# Patient Record
Sex: Female | Born: 1980 | Race: White | Hispanic: No | Marital: Single | Smoking: Never smoker
Health system: Southern US, Community
[De-identification: ages and names within clinical notes are randomized; demographics above are authoritative.]

## PROBLEM LIST (undated history)

## (undated) DIAGNOSIS — F329 Major depressive disorder, single episode, unspecified: Secondary | ICD-10-CM

## (undated) DIAGNOSIS — M797 Fibromyalgia: Secondary | ICD-10-CM

## (undated) DIAGNOSIS — F419 Anxiety disorder, unspecified: Secondary | ICD-10-CM

## (undated) DIAGNOSIS — F32A Depression, unspecified: Secondary | ICD-10-CM

---

## 2013-01-23 ENCOUNTER — Emergency Department (HOSPITAL_BASED_OUTPATIENT_CLINIC_OR_DEPARTMENT_OTHER): Payer: BC Managed Care – PPO

## 2013-01-23 ENCOUNTER — Encounter: Payer: Self-pay | Admitting: Emergency Medicine

## 2013-01-23 ENCOUNTER — Encounter (HOSPITAL_BASED_OUTPATIENT_CLINIC_OR_DEPARTMENT_OTHER): Payer: Self-pay | Admitting: Emergency Medicine

## 2013-01-23 ENCOUNTER — Emergency Department
Admission: EM | Admit: 2013-01-23 | Discharge: 2013-01-23 | Payer: BC Managed Care – PPO | Source: Home / Self Care | Attending: Emergency Medicine | Admitting: Emergency Medicine

## 2013-01-23 ENCOUNTER — Emergency Department (HOSPITAL_BASED_OUTPATIENT_CLINIC_OR_DEPARTMENT_OTHER)
Admission: EM | Admit: 2013-01-23 | Discharge: 2013-01-23 | Disposition: A | Discharge status: Home / Self Care | Payer: BC Managed Care – PPO | Attending: Emergency Medicine | Admitting: Emergency Medicine

## 2013-01-23 DIAGNOSIS — S61012A Laceration without foreign body of left thumb without damage to nail, initial encounter: Secondary | ICD-10-CM

## 2013-01-23 DIAGNOSIS — W298XXA Contact with other powered powered hand tools and household machinery, initial encounter: Secondary | ICD-10-CM | POA: Insufficient documentation

## 2013-01-23 DIAGNOSIS — Z880 Allergy status to penicillin: Secondary | ICD-10-CM | POA: Insufficient documentation

## 2013-01-23 DIAGNOSIS — F3289 Other specified depressive episodes: Secondary | ICD-10-CM | POA: Insufficient documentation

## 2013-01-23 DIAGNOSIS — S61209A Unspecified open wound of unspecified finger without damage to nail, initial encounter: Secondary | ICD-10-CM

## 2013-01-23 DIAGNOSIS — R209 Unspecified disturbances of skin sensation: Secondary | ICD-10-CM | POA: Insufficient documentation

## 2013-01-23 DIAGNOSIS — Z791 Long term (current) use of non-steroidal anti-inflammatories (NSAID): Secondary | ICD-10-CM | POA: Insufficient documentation

## 2013-01-23 DIAGNOSIS — Z79899 Other long term (current) drug therapy: Secondary | ICD-10-CM | POA: Insufficient documentation

## 2013-01-23 DIAGNOSIS — F329 Major depressive disorder, single episode, unspecified: Secondary | ICD-10-CM | POA: Insufficient documentation

## 2013-01-23 DIAGNOSIS — Y9389 Activity, other specified: Secondary | ICD-10-CM | POA: Insufficient documentation

## 2013-01-23 DIAGNOSIS — Z23 Encounter for immunization: Secondary | ICD-10-CM | POA: Insufficient documentation

## 2013-01-23 DIAGNOSIS — Y929 Unspecified place or not applicable: Secondary | ICD-10-CM | POA: Insufficient documentation

## 2013-01-23 DIAGNOSIS — F411 Generalized anxiety disorder: Secondary | ICD-10-CM | POA: Insufficient documentation

## 2013-01-23 HISTORY — DX: Major depressive disorder, single episode, unspecified: F32.9

## 2013-01-23 HISTORY — DX: Anxiety disorder, unspecified: F41.9

## 2013-01-23 HISTORY — DX: Depression, unspecified: F32.A

## 2013-01-23 HISTORY — DX: Fibromyalgia: M79.7

## 2013-01-23 MED ORDER — KETOROLAC TROMETHAMINE 30 MG/ML IJ SOLN
60.0000 mg | Freq: Once | INTRAMUSCULAR | Status: AC
  Administered 2013-01-23: 60 mg via INTRAMUSCULAR

## 2013-01-23 MED ORDER — HYDROCODONE-ACETAMINOPHEN 5-325 MG PO TABS: 2.0000 | ORAL_TABLET | ORAL | Status: AC | PRN

## 2013-01-23 MED ORDER — CLINDAMYCIN HCL 150 MG PO CAPS: 150.0000 mg | ORAL_CAPSULE | Freq: Four times a day (QID) | ORAL | Status: AC

## 2013-01-23 MED ORDER — OXYCODONE-ACETAMINOPHEN 5-325 MG PO TABS
2.0000 | ORAL_TABLET | Freq: Once | ORAL | Status: AC
  Administered 2013-01-23: 2 via ORAL
  Filled 2013-01-23: qty 2

## 2013-01-23 MED ORDER — TETANUS-DIPHTH-ACELL PERTUSSIS 5-2.5-18.5 LF-MCG/0.5 IM SUSP
0.5000 mL | Freq: Once | INTRAMUSCULAR | Status: AC
  Administered 2013-01-23: 0.5 mL via INTRAMUSCULAR
  Filled 2013-01-23: qty 0.5

## 2013-01-23 MED ORDER — BUPIVACAINE HCL (PF) 0.5 % IJ SOLN
INTRAMUSCULAR | Status: AC
  Filled 2013-01-23: qty 10

## 2013-01-23 NOTE — ED Provider Notes (Signed)
CSN: [id number]     Arrival date & time 01/23/13  1221 History   First MD Initiated Contact with Patient 01/23/13 1313     Chief Complaint  Patient presents with  . Extremity Laceration   (Consider location/radiation/quality/duration/timing/severity/associated sxs/prior Treatment) HPI Cut left thumb with new blade box cutter at 1100 today.   Past Medical History  Diagnosis Date  . Fibromyalgia   . Anxiety and depression    History reviewed. No pertinent past surgical history. History reviewed. No pertinent family history. History  Substance Use Topics  . Smoking status: Never Smoker   . Smokeless tobacco: Not on file  . Alcohol Use: No   OB History   Grav Para Term Preterm Abortions TAB SAB Ect Mult Living                 Review of Systems  Allergies  Amoxicillin and Duricef  Home Medications   Current Outpatient Rx  Name  Route  Sig  Dispense  Refill  . celecoxib (CELEBREX) 200 MG capsule   Oral   Take 200 mg by mouth 2 (two) times daily.         . clonazePAM (KLONOPIN) 0.5 MG tablet   Oral   Take 20 mg by mouth 3 (three) times daily as needed for anxiety.         Marland Kitchen. LORazepam (ATIVAN) 1 MG tablet   Oral   Take 1 mg by mouth every 8 (eight) hours.          BP 124/77  Pulse 73  Temp(Src) 98 F (36.7 C) (Oral)  Resp 16  Ht 5\' 3"  (1.6 m)  Wt 114 lb (51.71 kg)  BMI 20.20 kg/m2  SpO2 100% Physical Exam  Constitutional: She appears well-developed and well-nourished. She appears distressed (In severe pain from left thumb pain).  Musculoskeletal:       Hands: Gaping wound sagittally through distal left thumb, transecting the nail sagittally, deep towards the distal phalanx bone.  Bleeding stopped with direct pressure.  Neurological: She is alert.    ED Course  Procedures (including critical care time) Labs Review Labs Reviewed - No data to display Imaging Review No results found.  EKG Interpretation    Date/Time:    Ventricular Rate:      PR Interval:    QRS Duration:   QT Interval:    QTC Calculation:   R Axis:     Text Interpretation:              MDM   1. Laceration of left thumb with complication    Deep sagittal laceration left thumb, through fingernail extending toward bone. Toradol 60 mg IM stat at patient's request for shot of acute pain med.--She tolerated this well and pain relieved somewhat. L thumb redressed, and referred to ER stat for further diagnosis and treatment because of the severity of the laceration --Patient and aunt preferred emergency room at [facility]. [facility], and explained situation and acuity.  Family member (aunt) drove her to ER stat.  Lajean Manesavid Massey, MD 01/23/13 1356

## 2013-01-23 NOTE — ED Provider Notes (Signed)
CSN: [id number]     Arrival date & time 01/23/13  1349 History  This chart was scribed for Glynn Octave, MD by Blanchard Kelch, ED Scribe. The patient was seen in room MH03/MH03. Patient's care was started at 3:19 PM.    Chief Complaint  Patient presents with  . Extremity Laceration    The history is provided by the patient. No language interpreter was used.    HPI Comments: Christina Henson is a 33 y.o. female who presents to the Emergency Department complaining of a left thumb laceration that occurred four hours ago. She states that she accidentally sliced it with a box cutter. She was seen at Urgent Care but was sent here due to the severity of the laceration. The bleeding is currently controlled. She is complaining of constant, moderate pain to the associated area onset immediately after the cutting it. She has associated numbness in her thumb that radiates up to the elbow. She is not up to date on her tetanus vaccination and did not receive a booster at the Urgent Care. She is allergic to Amoxicillin and Duricef.   Past Medical History  Diagnosis Date  . Fibromyalgia   . Anxiety and depression    History reviewed. No pertinent past surgical history. No family history on file. History  Substance Use Topics  . Smoking status: Never Smoker   . Smokeless tobacco: Not on file  . Alcohol Use: No   OB History   Grav Para Term Preterm Abortions TAB SAB Ect Mult Living                 Review of Systems A complete 10 system review of systems was obtained and all systems are negative except as noted in the HPI and PMH.    Allergies  Amoxicillin and Duricef  Home Medications   Current Outpatient Rx  Name  Route  Sig  Dispense  Refill  . celecoxib (CELEBREX) 200 MG capsule   Oral   Take 200 mg by mouth 2 (two) times daily.         . clindamycin (CLEOCIN) 150 MG capsule   Oral   Take 1 capsule (150 mg total) by mouth every 6 (six) hours.   28 capsule   0   . clonazePAM  (KLONOPIN) 0.5 MG tablet   Oral   Take 20 mg by mouth 3 (three) times daily as needed for anxiety.         Marland Kitchen HYDROcodone-acetaminophen (NORCO/VICODIN) 5-325 MG per tablet   Oral   Take 2 tablets by mouth every 4 (four) hours as needed.   10 tablet   0   . LORazepam (ATIVAN) 1 MG tablet   Oral   Take 1 mg by mouth every 8 (eight) hours.          Triage Vitals: BP 132/81  Pulse 74  Temp(Src) 99.3 F (37.4 C) (Oral)  Resp 18  SpO2 100%  Physical Exam  Nursing note and vitals reviewed. Constitutional: She is oriented to person, place, and time. She appears well-developed and well-nourished. No distress.  HENT:  Head: Normocephalic and atraumatic.  Mouth/Throat: Oropharynx is clear and moist. No oropharyngeal exudate.  Eyes: Conjunctivae and EOM are normal. Pupils are equal, round, and reactive to light.  Neck: Neck supple. No tracheal deviation present.  Cardiovascular: Normal rate, regular rhythm and normal heart sounds.   No murmur heard. Pulses:      Radial pulses are 2+ on the left side.  2+ radial  pulse.   Pulmonary/Chest: Effort normal. No respiratory distress.  Abdominal: Soft. There is no tenderness. There is no rebound and no guarding.  Musculoskeletal: Normal range of motion. She exhibits no edema and no tenderness.  Neurological: She is alert and oriented to person, place, and time. No cranial nerve deficit. She exhibits normal muscle tone. Coordination normal.  Skin: Skin is warm and dry.  Laceration of left thumb tip, sagittal through nailbed. Thumb extension, flexion, opposition and adduction intact. Distal sensation intact.   Psychiatric: She has a normal mood and affect. Her behavior is normal.    ED Course  Procedures (including critical care time)  DIAGNOSTIC STUDIES: Oxygen Saturation is 100% on room air, normal by my interpretation.    COORDINATION OF CARE: 3:24 PM -Waiting for x-ray results to decide treatment plan. Patient verbalizes  understanding and agrees with treatment plan.    Labs Review Labs Reviewed - No data to display Imaging Review Dg Finger Thumb Left  01/23/2013   CLINICAL DATA:  Deep laceration distal in the the thumb, caught within nights this morning  EXAM: LEFT THUMB 2+V  COMPARISON:  None.  FINDINGS: Bandage obscures bony and soft tissue detail. Allowing for this, soft tissue defect is identified. No opaque foreign body is appreciated. No fracture is appreciated.  IMPRESSION: Study limited by presence of bandage. Allowing for this, no fracture.   Electronically Signed   By: Esperanza Heiraymond  Rubner M.D.   On: 01/23/2013 15:41    EKG Interpretation   None       MDM   1. Laceration of thumb, left, complicated    Left thumb laceration from a box cutter about 11:30 AM. Sent from urgent care as wound goes through nailbed and distal thumb tip. No weakness, numbness or tingling. Bleeding controlled.   X-ray, update tetanus. X-rays negative for fracture. No evidence of tendon, nerve or vascular involvement.  Laceration repaired by NP Neese. Patient will be given antibiotics, pain control, referral to hand surgery.    I personally performed the services described in this documentation, which was scribed in my presence. The recorded information has been reviewed and is accurate.    Glynn OctaveStephen Leilynn Pilat, MD 01/23/13 [phone]

## 2013-01-23 NOTE — ED Provider Notes (Signed)
THIS IS A SHARED VISIT WITH DR. Manus GunningANCOUR.  [name] is a 33 y.o. female with a laceration to the left thumb.  BP 132/81  Pulse 74  Temp(Src) 99.3 F (37.4 C) (Oral)  Resp 18  SpO2 100%  LMP 01/16/2013   LACERATION REPAIR Performed by: Tenicia Gural Authorized by: Bernon Arviso Consent: Verbal consent obtained. Risks and benefits: risks, benefits and alternatives were discussed Consent given by: patient  Patient identity confirmed: provided demographic data Prepped and Draped in normal sterile fashion  Wound explored  Laceration Location: left thumb through the nail on the radial aspect.   Laceration Length: 3 cm avulsion    No Foreign Bodies seen or palpated  Digital block with Sensorcaine 0.25 and lidocaine 2% mixed total 4 ccs  Irrigation method: syringe Amount of cleaning: standard  Skin closure: 5-0 prolene  Number of sutures: 4 (two of the sutures were through the nail)  Technique: interrupted  Patient tolerance: Patient tolerated the procedure well with no immediate complications.   [facility] Christina OchM Kristofor Michalowski, NP 01/24/13 0110

## 2013-01-23 NOTE — ED Notes (Signed)
Cut left thumb with new blade box cutter at 1100 today. Will need Tdap.

## 2013-01-23 NOTE — ED Notes (Addendum)
Patient here from an urgent care for further evaluation of deep laceration to left thumb, cut with knife this am. Received toradol pta

## 2013-01-23 NOTE — Discharge Instructions (Signed)
Return in 2 days to recheck the wound. Then the sutures will need to be removed in 7 days. Return immediately for signs of infection.

## 2013-01-24 NOTE — ED Provider Notes (Signed)
Medical screening examination/treatment/procedure(s) were conducted as a shared visit with non-physician practitioner(s) and myself.  I personally evaluated the patient during the encounter.  EKG Interpretation   None        Glynn OctaveStephen Jael Kostick, MD 01/24/13 1139

## 2013-01-25 ENCOUNTER — Encounter (HOSPITAL_BASED_OUTPATIENT_CLINIC_OR_DEPARTMENT_OTHER): Payer: Self-pay | Admitting: Emergency Medicine

## 2013-01-25 ENCOUNTER — Emergency Department (HOSPITAL_BASED_OUTPATIENT_CLINIC_OR_DEPARTMENT_OTHER)
Admission: EM | Admit: 2013-01-25 | Discharge: 2013-01-25 | Disposition: A | Discharge status: Home / Self Care | Payer: BC Managed Care – PPO | Attending: Emergency Medicine | Admitting: Emergency Medicine

## 2013-01-25 DIAGNOSIS — Z8739 Personal history of other diseases of the musculoskeletal system and connective tissue: Secondary | ICD-10-CM | POA: Insufficient documentation

## 2013-01-25 DIAGNOSIS — Z791 Long term (current) use of non-steroidal anti-inflammatories (NSAID): Secondary | ICD-10-CM | POA: Insufficient documentation

## 2013-01-25 DIAGNOSIS — F411 Generalized anxiety disorder: Secondary | ICD-10-CM | POA: Insufficient documentation

## 2013-01-25 DIAGNOSIS — Z4801 Encounter for change or removal of surgical wound dressing: Secondary | ICD-10-CM | POA: Insufficient documentation

## 2013-01-25 DIAGNOSIS — Z5189 Encounter for other specified aftercare: Secondary | ICD-10-CM

## 2013-01-25 MED ORDER — OXYCODONE-ACETAMINOPHEN 5-325 MG PO TABS: 1.0000 | ORAL_TABLET | ORAL | Status: AC | PRN

## 2013-01-25 NOTE — ED Notes (Signed)
Laceration to her left thumb 2 days ago. Sutured. Here for recheck and pain control.

## 2013-01-25 NOTE — ED Provider Notes (Signed)
CSN: [id number]     Arrival date & time 01/25/13  1720 History   First MD Initiated Contact with Patient 01/25/13 1929     Chief Complaint  Patient presents with  . Wound Check   (Consider location/radiation/quality/duration/timing/severity/associated sxs/prior Treatment) HPI Christina Henson is a 33 y.o. female who presents to ED with complaint of wound recheck. Pt states she cut her finger on a box cutter two days ago. Pt states that she had it repaired here in ED with sutures, she is here for recheck. States she was prescribed Norco for her pain but states she's unable to tolerate it due to severe nausea and itchiness. Patient states that her pain at is severe in her finger. She states that it is still oozing some clear liquid. She states that he did update her tetanus. She is also was placed on clindamycin which she's taking. She denies any redness, swelling, purulent drainage. She denies any new injuries. She denies any fever or chills.  Past Medical History  Diagnosis Date  . Fibromyalgia   . Anxiety and depression    History reviewed. No pertinent past surgical history. No family history on file. History  Substance Use Topics  . Smoking status: Never Smoker   . Smokeless tobacco: Not on file  . Alcohol Use: No   OB History   Grav Para Term Preterm Abortions TAB SAB Ect Mult Living                 Review of Systems  Constitutional: Negative for fever and chills.  Respiratory: Negative for cough, chest tightness and shortness of breath.   Cardiovascular: Negative for chest pain, palpitations and leg swelling.  Skin: Positive for wound. Negative for rash.  Neurological: Negative for dizziness, weakness and headaches.  All other systems reviewed and are negative.    Allergies  Amoxicillin and Duricef  Home Medications   Current Outpatient Rx  Name  Route  Sig  Dispense  Refill  . celecoxib (CELEBREX) 200 MG capsule   Oral   Take 200 mg by mouth 2 (two) times daily.         . clindamycin (CLEOCIN) 150 MG capsule   Oral   Take 1 capsule (150 mg total) by mouth every 6 (six) hours.   28 capsule   0   . clonazePAM (KLONOPIN) 0.5 MG tablet   Oral   Take 20 mg by mouth 3 (three) times daily as needed for anxiety.         Marland Kitchen HYDROcodone-acetaminophen (NORCO/VICODIN) 5-325 MG per tablet   Oral   Take 2 tablets by mouth every 4 (four) hours as needed.   10 tablet   0   . LORazepam (ATIVAN) 1 MG tablet   Oral   Take 1 mg by mouth every 8 (eight) hours.          BP 125/79  Pulse 86  Temp(Src) 98.6 F (37 C) (Oral)  Resp 16  SpO2 100%  LMP 01/16/2013 Physical Exam  Nursing note and vitals reviewed. Constitutional: She appears well-developed and well-nourished. No distress.  HENT:  Head: Normocephalic.  Eyes: Conjunctivae are normal.  Neck: Neck supple.  Pulmonary/Chest: Effort normal and breath sounds normal.  Musculoskeletal: She exhibits no edema.  Laceration to the distal left thumb, tip of the finger extending through distal 1/2 of the nail. 4 sutures intact. No dehiscence. No drainage. No erythema. Tender to the touch.   Neurological: She is alert.  Skin: Skin is warm and  dry.  Psychiatric: She has a normal mood and affect. Her behavior is normal.    ED Course  Procedures (including critical care time) Labs Review Labs Reviewed - No data to display Imaging Review No results found.  EKG Interpretation   None       MDM   1. Encounter for wound re-check     Patient is here for recheck of laceration to her from which was repaired 2 days ago. Her wound appears to be healing well at this time. There is no signs of infection or dehiscence. Patient is concerned because she kept hitting her thumb on different objects. We have applied a finger splint over the tip of the finger for protection. She is to apply antibiotic ointment twice a day. Continue to keep it clean. Followup for suture removal. Also unable to take norco, asking for  percocet. Will give few pills.   Filed Vitals:   01/25/13 1735  BP: 125/79  Pulse: 86  Temp: 98.6 F (37 C)  TempSrc: Oral  Resp: 16  SpO2: 100%       Noe Goyer A Dontavis Tschantz, PA-C 01/26/13 0050

## 2013-01-25 NOTE — Discharge Instructions (Signed)
Keep finger clean. Apply neosporin with pain relief. Take ibuprofen for pain. Percocet for severe pain only. Follow up with your doctor as needed and for suture relief.   Laceration Care, Adult A laceration is a cut or lesion that goes through all layers of the skin and into the tissue just beneath the skin. TREATMENT  Some lacerations may not require closure. Some lacerations may not be able to be closed due to an increased risk of infection. It is important to see your caregiver as soon as possible after an injury to minimize the risk of infection and maximize the opportunity for successful closure. If closure is appropriate, pain medicines may be given, if needed. The wound will be cleaned to help prevent infection. Your caregiver will use stitches (sutures), staples, wound glue (adhesive), or skin adhesive strips to repair the laceration. These tools bring the skin edges together to allow for faster healing and a better cosmetic outcome. However, all wounds will heal with a scar. Once the wound has healed, scarring can be minimized by covering the wound with sunscreen during the day for 1 full year. HOME CARE INSTRUCTIONS  For sutures or staples:  Keep the wound clean and dry.  If you were given a bandage (dressing), you should change it at least once a day. Also, change the dressing if it becomes wet or dirty, or as directed by your caregiver.  Wash the wound with soap and water 2 times a day. Rinse the wound off with water to remove all soap. Pat the wound dry with a clean towel.  After cleaning, apply a thin layer of the antibiotic ointment as recommended by your caregiver. This will help prevent infection and keep the dressing from sticking.  You may shower as usual after the first 24 hours. Do not soak the wound in water until the sutures are removed.  Only take over-the-counter or prescription medicines for pain, discomfort, or fever as directed by your caregiver.  Get your sutures or  staples removed as directed by your caregiver. For skin adhesive strips:  Keep the wound clean and dry.  Do not get the skin adhesive strips wet. You may bathe carefully, using caution to keep the wound dry.  If the wound gets wet, pat it dry with a clean towel.  Skin adhesive strips will fall off on their own. You may trim the strips as the wound heals. Do not remove skin adhesive strips that are still stuck to the wound. They will fall off in time. For wound adhesive:  You may briefly wet your wound in the shower or bath. Do not soak or scrub the wound. Do not swim. Avoid periods of heavy perspiration until the skin adhesive has fallen off on its own. After showering or bathing, gently pat the wound dry with a clean towel.  Do not apply liquid medicine, cream medicine, or ointment medicine to your wound while the skin adhesive is in place. This may loosen the film before your wound is healed.  If a dressing is placed over the wound, be careful not to apply tape directly over the skin adhesive. This may cause the adhesive to be pulled off before the wound is healed.  Avoid prolonged exposure to sunlight or tanning lamps while the skin adhesive is in place. Exposure to ultraviolet light in the first year will darken the scar.  The skin adhesive will usually remain in place for 5 to 10 days, then naturally fall off the skin. Do not  pick at the adhesive film. You may need a tetanus shot if:  You cannot remember when you had your last tetanus shot.  You have never had a tetanus shot. If you get a tetanus shot, your arm may swell, get red, and feel warm to the touch. This is common and not a problem. If you need a tetanus shot and you choose not to have one, there is a rare chance of getting tetanus. Sickness from tetanus can be serious. SEEK MEDICAL CARE IF:   You have redness, swelling, or increasing pain in the wound.  You see a red line that goes away from the wound.  You have  yellowish-white fluid (pus) coming from the wound.  You have a fever.  You notice a bad smell coming from the wound or dressing.  Your wound breaks open before or after sutures have been removed.  You notice something coming out of the wound such as wood or glass.  Your wound is on your hand or foot and you cannot move a finger or toe. SEEK IMMEDIATE MEDICAL CARE IF:   Your pain is not controlled with prescribed medicine.  You have severe swelling around the wound causing pain and numbness or a change in color in your arm, hand, leg, or foot.  Your wound splits open and starts bleeding.  You have worsening numbness, weakness, or loss of function of any joint around or beyond the wound.  You develop painful lumps near the wound or on the skin anywhere on your body. MAKE SURE YOU:   Understand these instructions.  Will watch your condition.  Will get help right away if you are not doing well or get worse. Document Released: 12/24/2004 Document Revised: 03/18/2011 Document Reviewed: 06/19/2010 [facility] Patient Information 2014 [place], [place].

## 2013-01-26 NOTE — ED Provider Notes (Signed)
Medical screening examination/treatment/procedure(s) were performed by non-physician practitioner and as supervising physician I was immediately available for consultation/collaboration.  Megan E Docherty, MD 01/26/13 1104 

## 2014-07-24 IMAGING — CR DG FINGER THUMB 2+V*L*
3 series · 3 of 3 positions shown · non-contrast
Comparison: None.

CLINICAL DATA: Deep laceration distal in the the thumb, caught
within nights this morning

EXAM:
LEFT THUMB 2+V

[x finger obl. left]
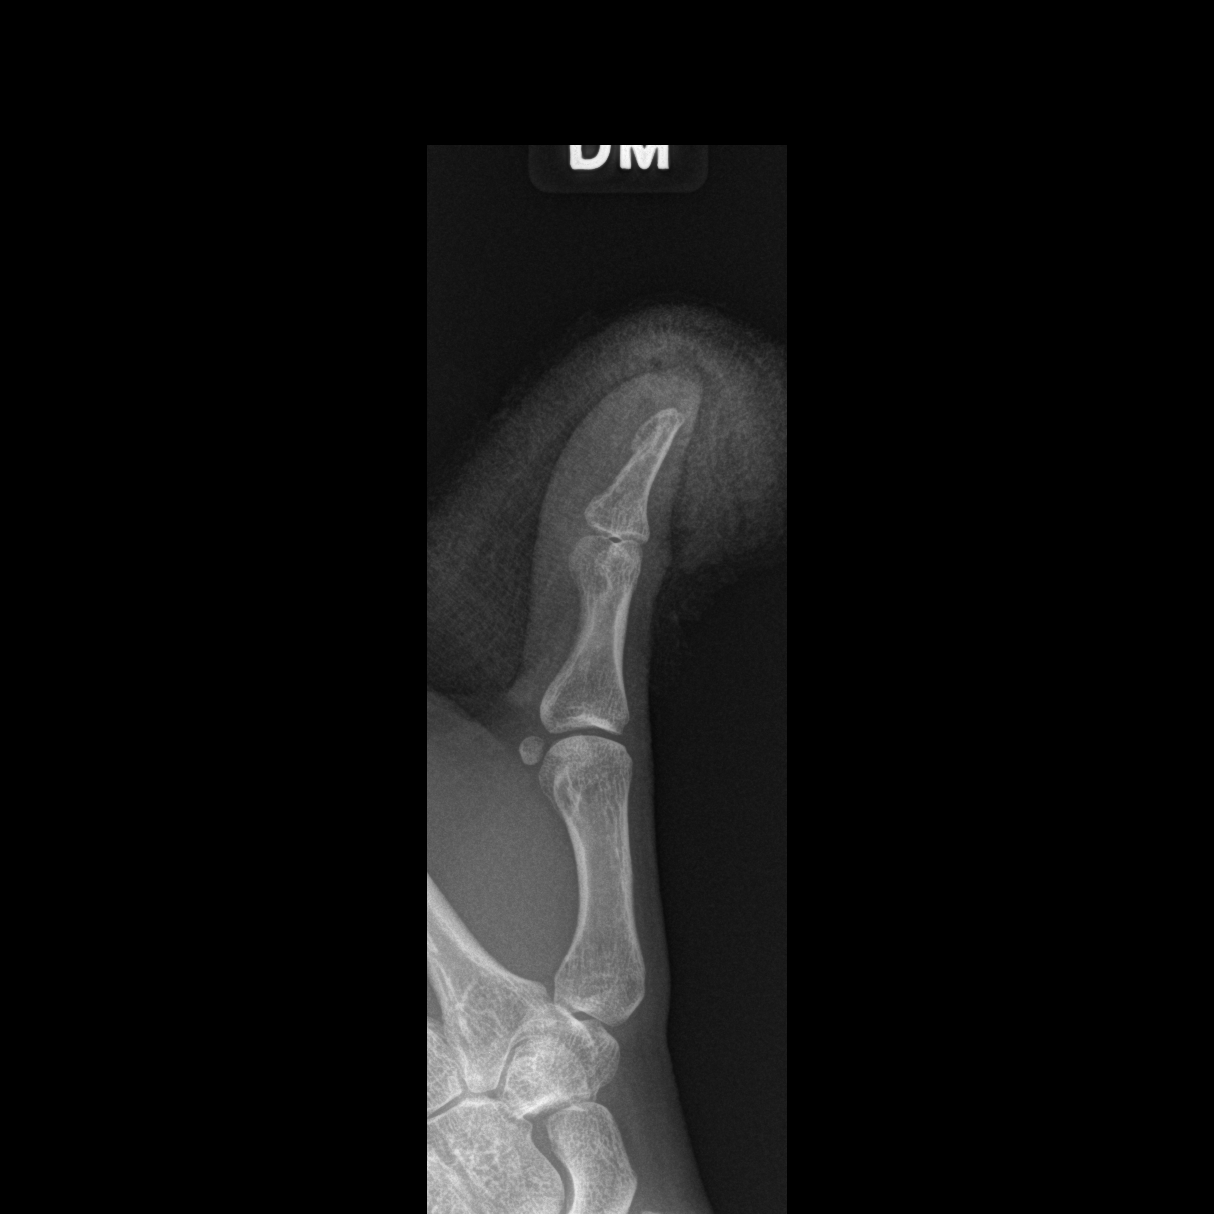

[x finger lateral left]
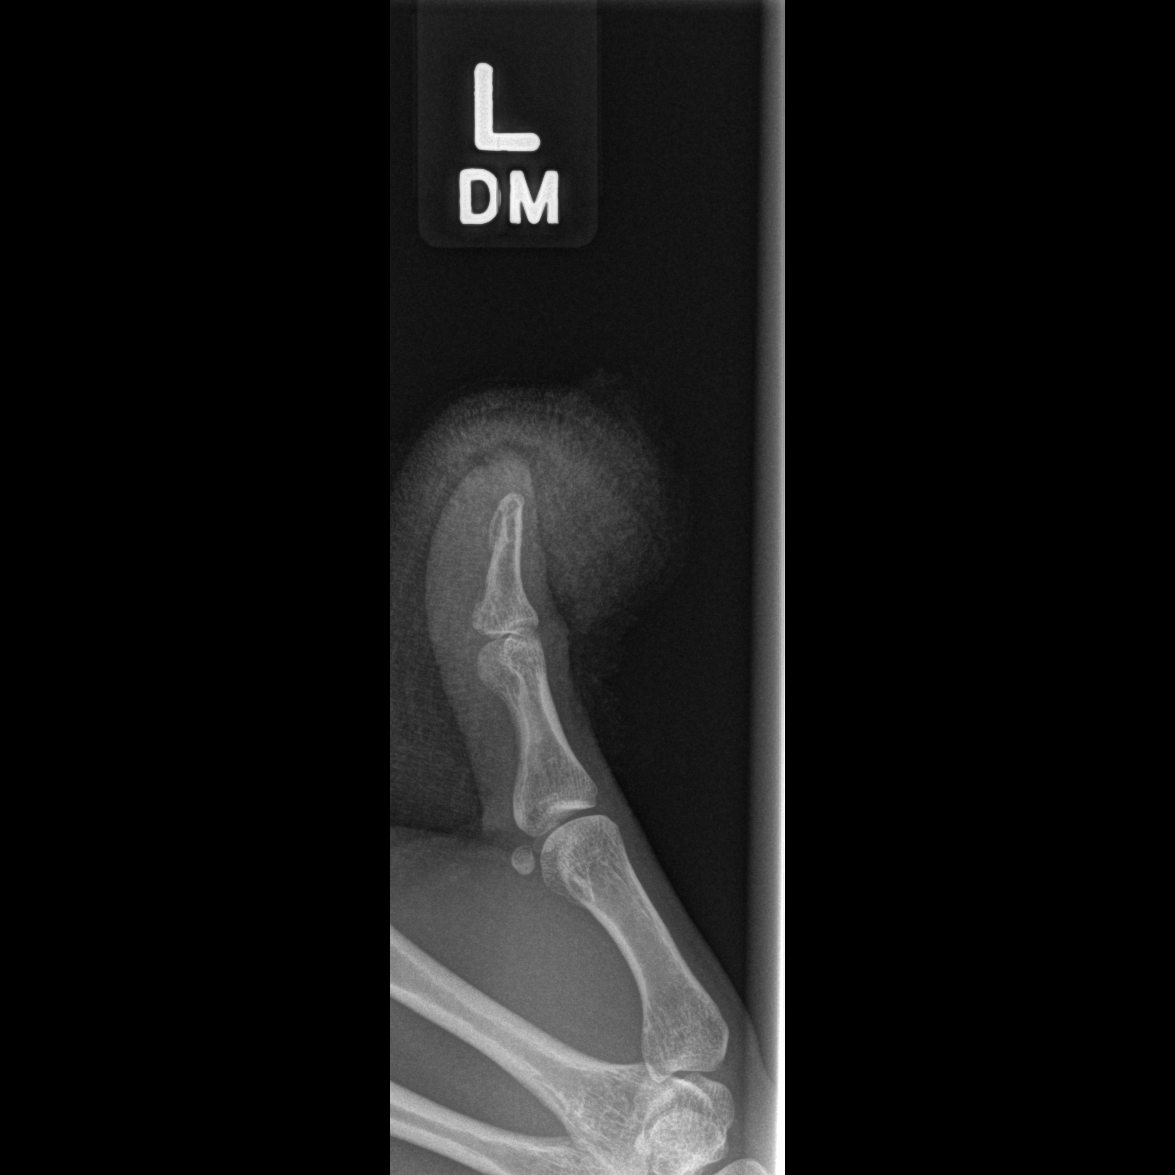

[x finger pa left]
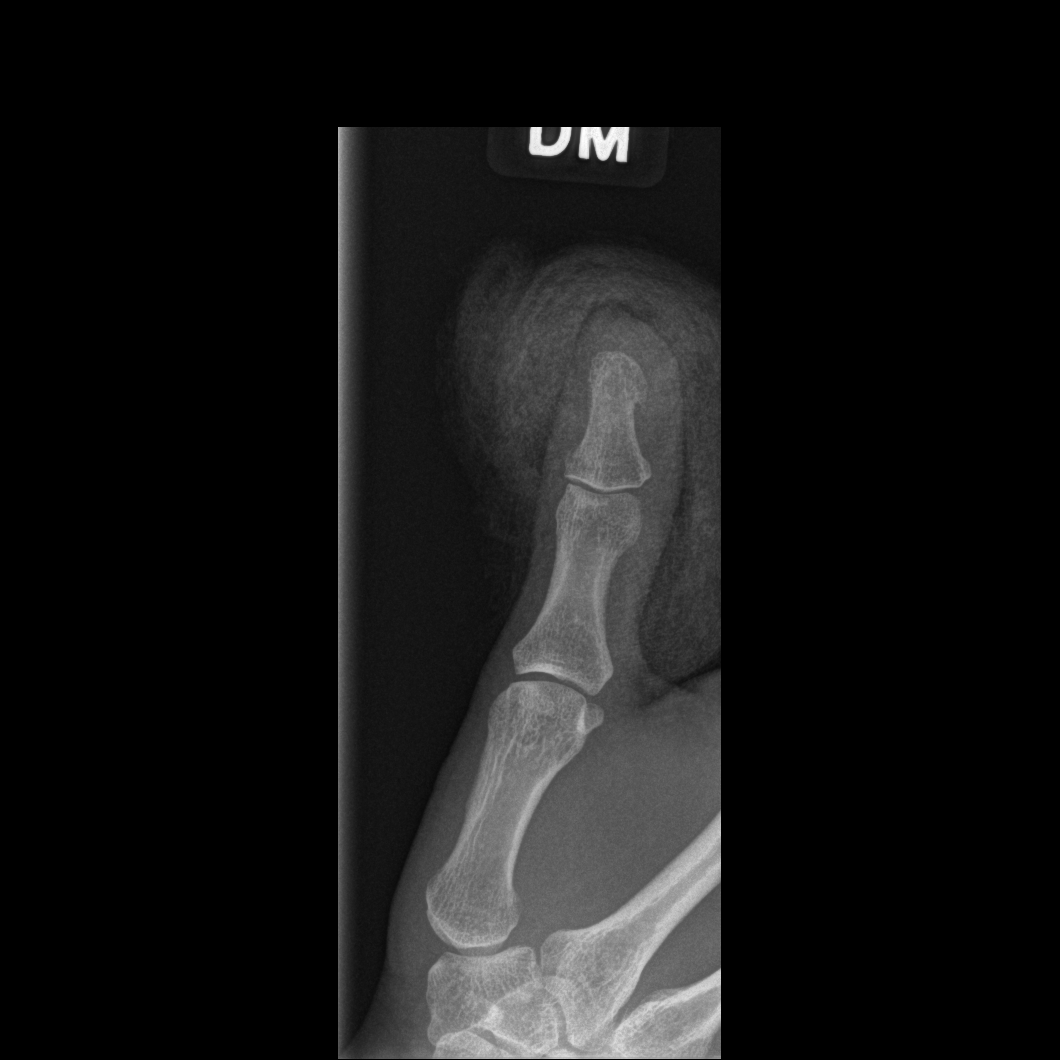

[3 of 3 positions shown; findings below may reference images not displayed]

FINDINGS: Bandage obscures bony and soft tissue detail. Allowing for this,
soft tissue defect is identified. No opaque foreign body is
appreciated. No fracture is appreciated.
IMPRESSION: Study limited by presence of bandage. Allowing for this, no
fracture.

## 2015-04-07 ENCOUNTER — Telehealth: Payer: Self-pay | Admitting: Pediatrics

## 2015-04-08 NOTE — Telephone Encounter (Signed)
My Chart note
